# Patient Record
Sex: Male | Born: 1990 | Race: White | Hispanic: No | Marital: Single | Smoking: Never smoker
Health system: Southern US, Community
[De-identification: ages and names within clinical notes are randomized; demographics above are authoritative.]

---

## 2012-10-30 ENCOUNTER — Emergency Department: Payer: Self-pay | Admitting: Emergency Medicine

## 2014-11-16 ENCOUNTER — Other Ambulatory Visit: Payer: Self-pay | Admitting: *Deleted

## 2014-11-16 DIAGNOSIS — M79605 Pain in left leg: Secondary | ICD-10-CM

## 2014-11-17 ENCOUNTER — Ambulatory Visit
Admission: RE | Admit: 2014-11-17 | Discharge: 2014-11-17 | Disposition: A | Discharge status: Home / Self Care | Payer: Self-pay | Source: Ambulatory Visit | Attending: Sports Medicine | Admitting: Sports Medicine

## 2014-11-17 ENCOUNTER — Ambulatory Visit (INDEPENDENT_AMBULATORY_CARE_PROVIDER_SITE_OTHER): Payer: Managed Care, Other (non HMO) | Admitting: Sports Medicine

## 2014-11-17 ENCOUNTER — Encounter: Payer: Self-pay | Admitting: Sports Medicine

## 2014-11-17 VITALS — BP 121/78 | Ht 75.0 in | Wt 290.0 lb

## 2014-11-17 DIAGNOSIS — M79605 Pain in left leg: Secondary | ICD-10-CM

## 2014-11-17 DIAGNOSIS — M25572 Pain in left ankle and joints of left foot: Secondary | ICD-10-CM

## 2014-11-17 NOTE — Progress Notes (Signed)
   Subjective:    Patient ID: Johnny Mccarthy, male    DOB: 11-10-1990, 24 y.o.   MRN: [id number]  HPI chief complaint: Left ankle pain  24 year old [employer] comes in today complaining of 2 weeks of lateral left ankle pain. He was initially injured in a game when someone fell on the back of his left leg. His immediate pain was in his left calf but his calf pain has now improved. However he is left with some residual left lateral ankle pain. He has a history of a prior nondisplaced left distal fibular fracture 2 years ago and he is concerned about a new fracture. His pain is similar in nature to what he experienced at the time of that injury. He denies any swelling or bruising. He denies any medial ankle pain. He denies any pain in his knee. He denies any pain in his foot. He has been able to walk without too much discomfort and in fact was able to play this past weekend despite his pain. He denies numbness or tingling. He has not been taking any sort of pain medication.  Past medical history reviewed Medications reviewed Allergies reviewed Social history reviewed    Review of Systems    as above Objective:   Physical Exam Well-developed, well-nourished. No acute distress. Awake alert and oriented 3. Vital signs reviewed.  Left ankle: There is tenderness to palpation directly over the distal fibula. No tenderness along the posterior tibialis tendon. No tenderness to palpation at the syndesmosis. No soft tissue swelling. No ankle effusion. No tenderness to palpation along the medial ankle. Negative talar tilt, negative anterior drawer. No calf tenderness to palpation. No tenderness to palpation at the fibular head. Neurovascularly intact distally. Walking without a limp.  X-rays of the left ankle including AP, lateral, and mortise views done earlier today are reviewed. There is no acute fracture. Mortise is intact. There is callus along the distal fibular shaft consistent  with his prior fibular fracture. But again I see no obvious acute fracture.  [facility] ultrasound was also performed. There is no obvious cortical irregularity or increase in neovascularity along the area of tenderness at the distal fibula.       Assessment & Plan:  Left ankle pain likely secondary to simple contusion  His location of pain is directly over the distal fibula. Given his normal radiographs, normal ultrasound, and otherwise normal physical exam I think that this is likely a simple contusion to his distal fibula. I have given him an Aircast to wear for the next couple of weeks and he will start taking Aleve twice daily for the next 4-5 days. He will not participate in this weekend's game but he may return to rugby in one to 2 weeks depending on his symptoms. If symptoms persist or worsen he will notify me. Otherwise follow-up as needed.

## 2015-02-21 ENCOUNTER — Ambulatory Visit (INDEPENDENT_AMBULATORY_CARE_PROVIDER_SITE_OTHER): Payer: Managed Care, Other (non HMO) | Admitting: Sports Medicine

## 2015-02-21 ENCOUNTER — Encounter: Payer: Self-pay | Admitting: Sports Medicine

## 2015-02-21 VITALS — BP 121/74 | HR 86 | Ht 75.0 in | Wt 290.0 lb

## 2015-02-21 DIAGNOSIS — M25572 Pain in left ankle and joints of left foot: Secondary | ICD-10-CM | POA: Diagnosis not present

## 2015-02-21 NOTE — Progress Notes (Signed)
   Subjective:    Patient ID: Johnny Mccarthy, male    DOB: May 26, 1990, 25 y.o.   MRN: [id number]  HPI   Patient comes in today with returning left ankle pain. He was last seen in our office on 11/17/2014 with a similar complaint. X-rays at that time were unremarkable. He was treated for a simple contusion to the distal fibula and was given an Aircast to wear. His symptoms did resolve but resumed a couple of weeks ago when returning to rugby. After 3 days of practice his pain returned. He localizes his pain to the distal third of the fibular shaft. No swelling. No new trauma. No numbness or tingling. He does have an old remote distal fibular fracture in this same area.  Interim medical history reviewed Medications reviewed Allergies reviewed    Review of Systems As above    Objective:   Physical Exam Well-developed, well-nourished. No acute distress  Left ankle: Full range of motion. No effusion. There is tenderness to palpation focally along the distal third of the fibular shaft. No tenderness to palpation at the calf or along the peroneal tendon. No ecchymosis. Neurovascularly intact distally.         Assessment & Plan:  Returning left ankle pain concerning for distal fibular stress fracture  Previous x-rays were unremarkable. An ultrasound done in our office previously was also unremarkable. I think we need to pursue more advanced imaging in the form of an MRI specifically to rule out a stress fracture to the distal fibula. I will call the patient with those results once available at which point we will delineate more definitive treatment. In the meantime, patient will remain out of rugby until further notice.

## 2015-02-26 ENCOUNTER — Other Ambulatory Visit: Payer: Self-pay

## 2015-03-12 ENCOUNTER — Telehealth: Payer: Self-pay | Admitting: Sports Medicine

## 2015-03-12 ENCOUNTER — Ambulatory Visit
Admission: RE | Admit: 2015-03-12 | Discharge: 2015-03-12 | Disposition: A | Discharge status: Home / Self Care | Payer: Managed Care, Other (non HMO) | Source: Ambulatory Visit | Attending: Sports Medicine | Admitting: Sports Medicine

## 2015-03-12 DIAGNOSIS — M25572 Pain in left ankle and joints of left foot: Secondary | ICD-10-CM

## 2015-03-12 NOTE — Telephone Encounter (Signed)
I spoke with the patient on the phone today regarding the MRI of his left ankle which was done on 03/12/2015. He does have evidence of a distal fibular stress fracture. 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 Clinically his pain is along the distal fibula. His symptoms have improved. He has not yet returned to rugby. I've explained to him that this is a very stable type of stress fracture and he can start to increase activity as tolerated. I did recommend that he wear his Aircast when he returns to sport. I also explained that it may be a few weeks before he is able to return to all activities pain-free. Follow-up with me as needed.

## 2017-02-04 IMAGING — MR MR ANKLE*L* W/O CM
4 of 5 series · 15 of 40 positions shown · non-contrast
Comparison: 11/17/2014

CLINICAL DATA: Lateral ankle pain after playing rugby in October 2014.

EXAM:
MRI OF THE LEFT ANKLE WITHOUT CONTRAST
TECHNIQUE: Multiplanar, multisequence MR imaging of the ankle was performed. No
intravenous contrast was administered.

[Series 4: T2 fat-sat · axial · 3.5mm · 0.30mm/px · z∈[-107,+21]mm · 3 of 46 slices shown (1 of 3)]
[im 9/46]
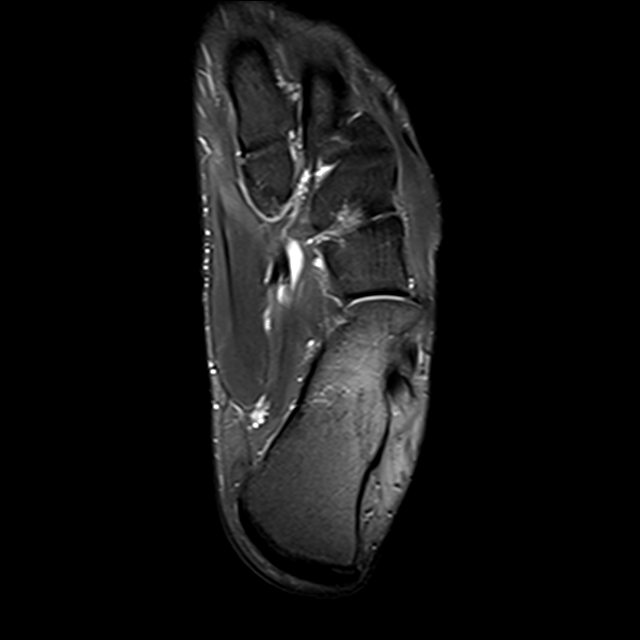
[im 25/46]
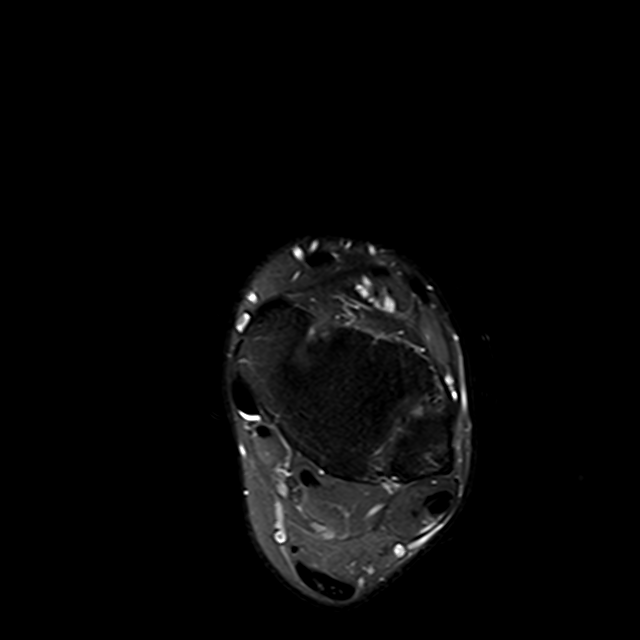
[im 41/46]
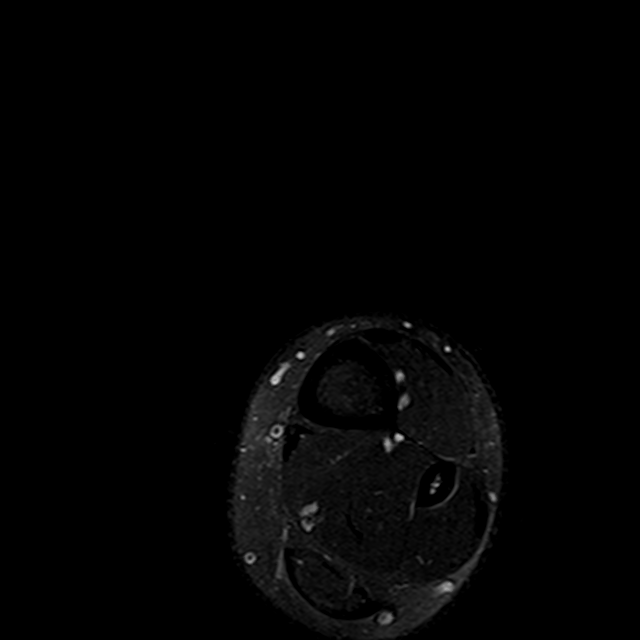

[Series 6: T2 fat-sat · sagittal · 3.5mm · 0.41mm/px · 3 of 23 slices shown (2 of 3)]
[im 1/23]
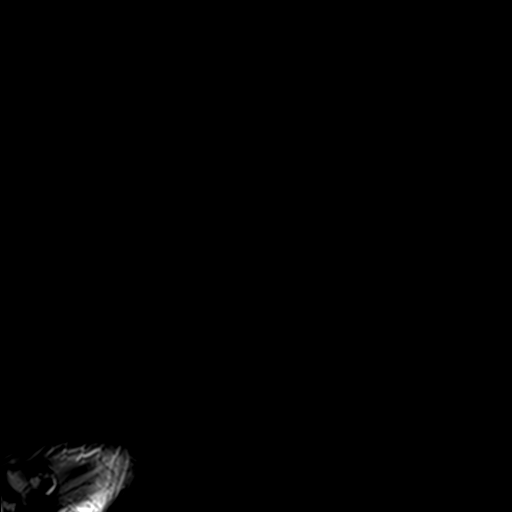
[im 12/23]
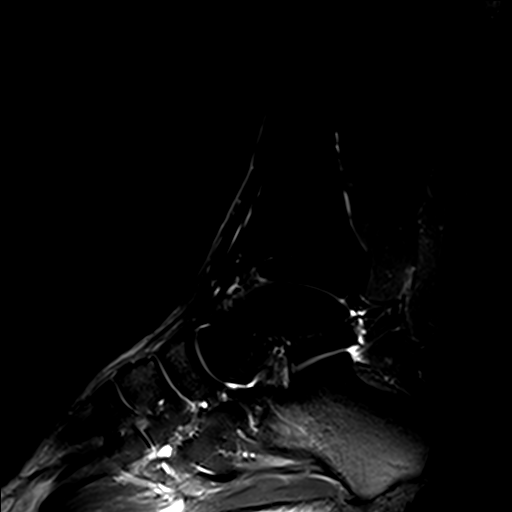
[im 23/23]
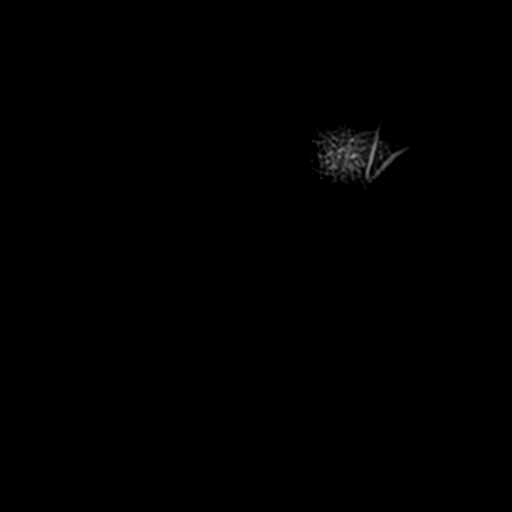

[Series 8: T2 fat-sat · coronal · 3.3mm · 0.43mm/px · 3 of 30 slices shown (3 of 3)]
[im 5/30]
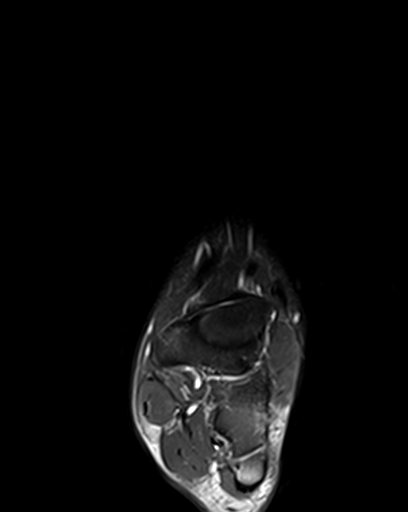
[im 15/30]
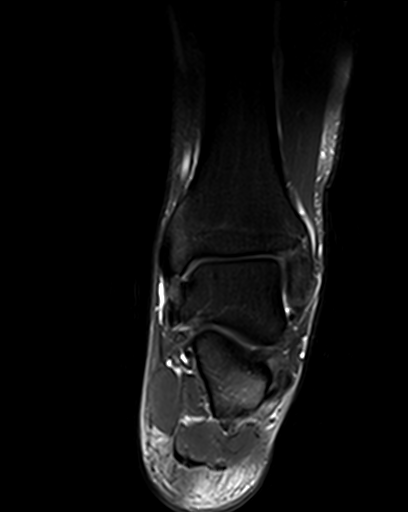
[im 25/30]
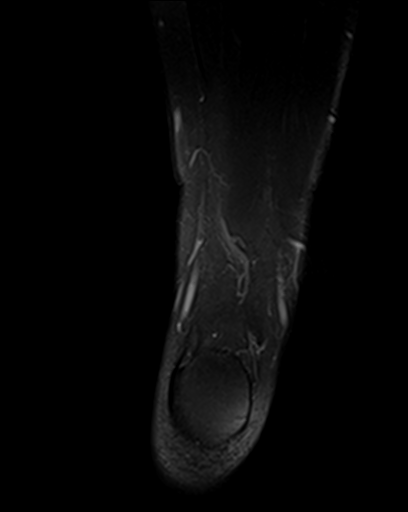

[Series 9: PD fat-sat · axial · 3.5mm · 0.30mm/px · z∈[-136,+32]mm · 6 of 48 slices shown]
[im 1/48]
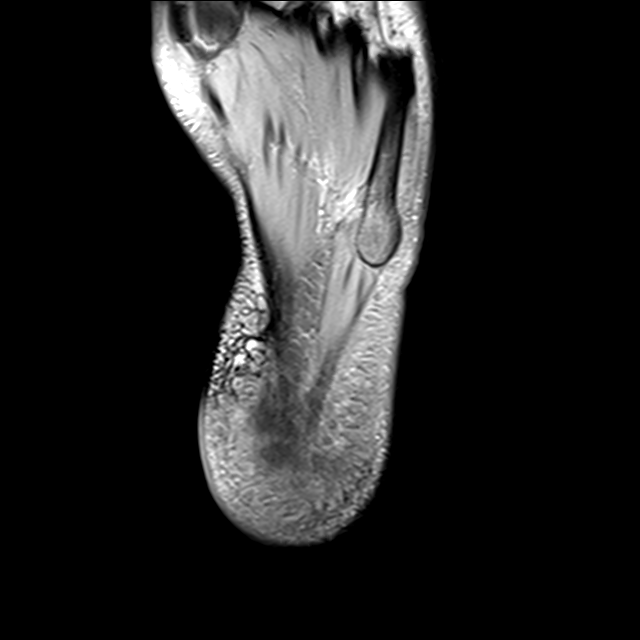
[im 5/48]
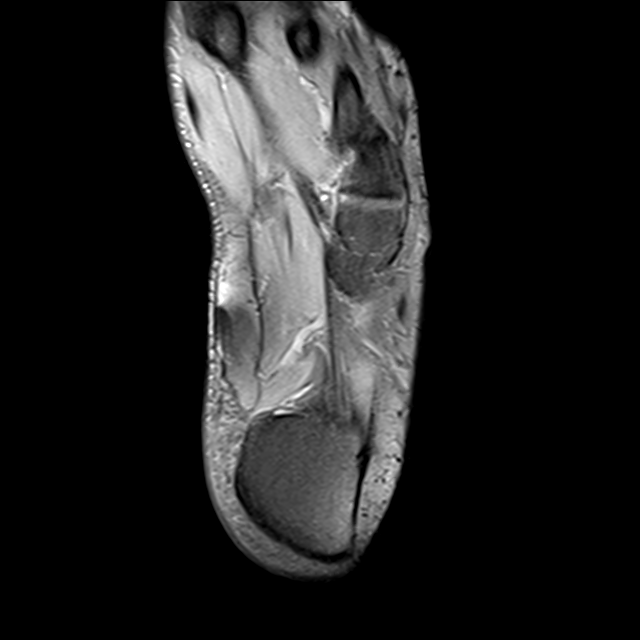
[im 10/48]
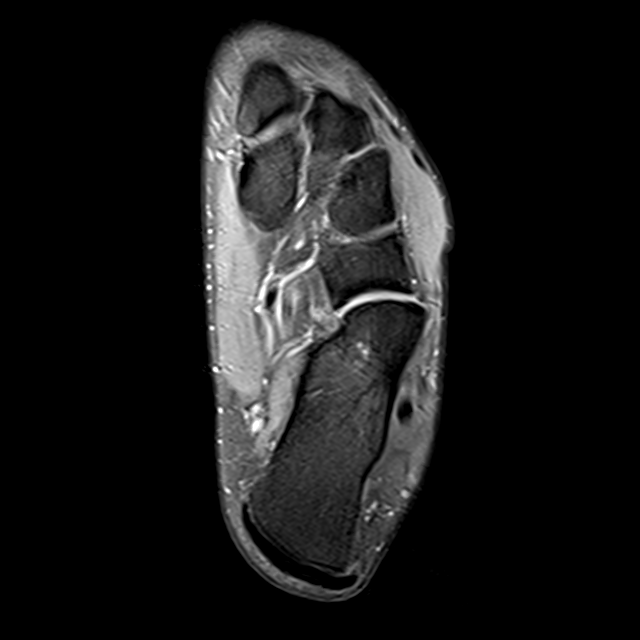
[im 15/48]
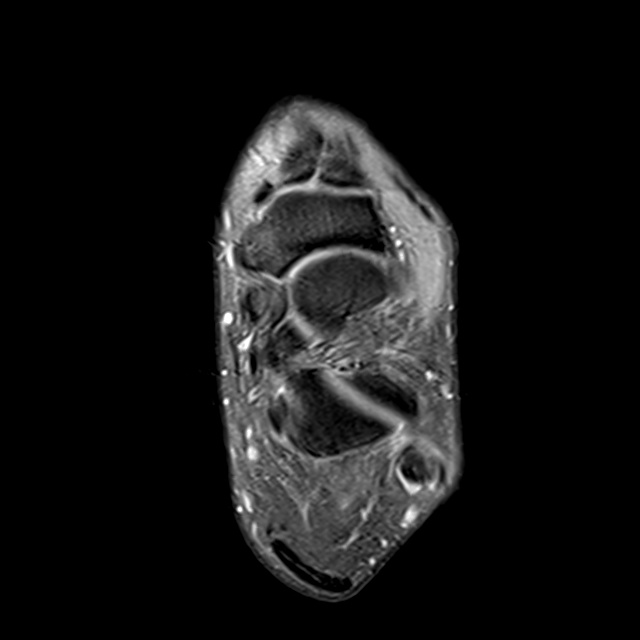
[im 24/48]
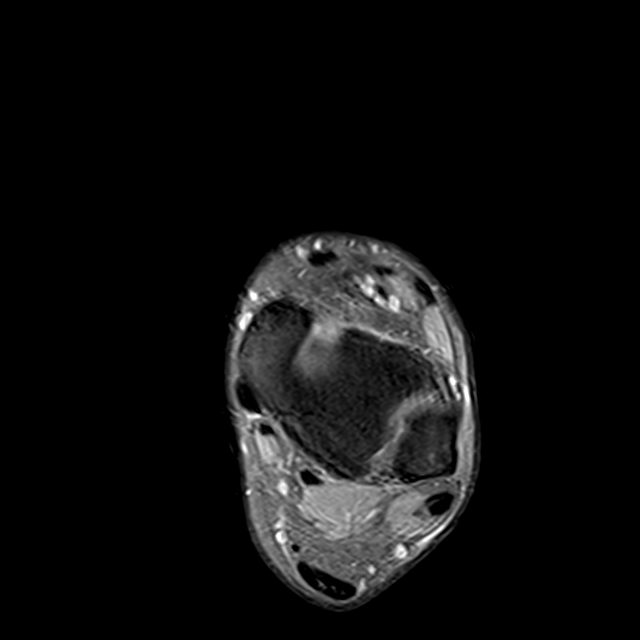
[im 43/48]
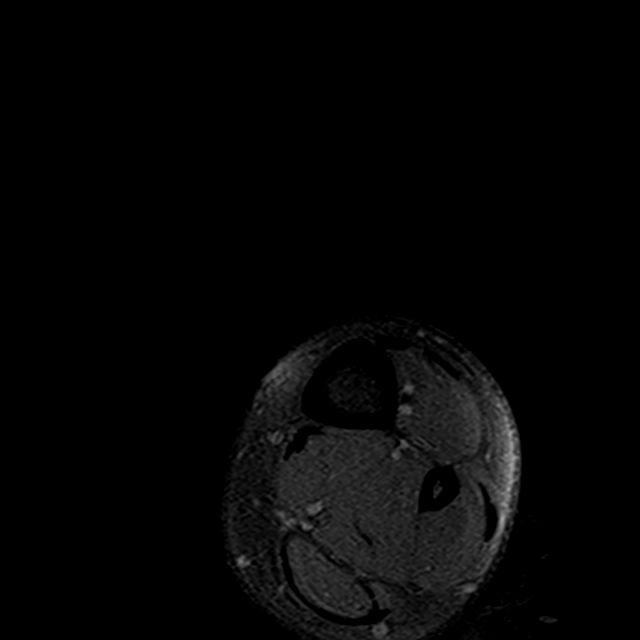

[15 of 40 positions shown; findings below may reference images not displayed]

FINDINGS: TENDONS

Peroneal: Longitudinal split tear of the peroneus brevis posterior
to and inferior to the lateral malleolus, with mild common peroneus
tendon sheath tenosynovitis.

Posteromedial: Tibialis posterior tenosynovitis. Mild tenosynovitis
of the flexor hallucis longus tendon just proximal to the knot of
Henry.

Anterior: Unremarkable

Achilles: Unremarkable

Plantar Fascia: Unremarkable

LIGAMENTS

Lateral: Indistinct calcaneofibular ligament.

Medial: Thickened superomedial component of the spring ligament with
some increased internal signal.

CARTILAGE

Ankle Joint: Unremarkable

Subtalar Joints/Sinus Tarsi: Unremarkable

Bones: There is abnormal marrow edema and periostitis in the distal
fibular metadiaphysis with overlying subcutaneous edema as shown on
image 12 series 4. Slight medial cortical expansion.
IMPRESSION: 1. Distal fibular stress fracture involving the metadiaphysis.
2. Longitudinal split tear of the peroneus brevis in the vicinity of
the lateral malleolus, with adjacent common peroneus tendon sheath
tenosynovitis.
3. Thickened superomedial component of the spring ligament with some
increased internal signal.
4. Mild tibialis posterior and flexor hallucis longus tenosynovitis.
5. Mildly indistinct calcaneofibular ligament observed although
probably intact on images 33-34 of series [DATE].
